# Patient Record
Sex: Male | Born: 1965 | Race: Black or African American | Hispanic: No | Smoking: Never smoker
Health system: Southern US, Community
[De-identification: ages and names within clinical notes are randomized; demographics above are authoritative.]

## PROBLEM LIST (undated history)

## (undated) HISTORY — PX: NO PAST SURGERIES: SHX2092

---

## 2006-04-01 ENCOUNTER — Emergency Department: Payer: Self-pay | Admitting: Emergency Medicine

## 2014-03-15 ENCOUNTER — Ambulatory Visit: Payer: Self-pay | Admitting: Physician Assistant

## 2014-05-18 ENCOUNTER — Ambulatory Visit
Admit: 2014-05-18 | Disposition: A | Discharge status: Home / Self Care | Payer: Self-pay | Attending: Family Medicine | Admitting: Family Medicine

## 2014-05-18 LAB — OCCULT BLOOD X 1 CARD TO LAB, STOOL: Occult Blood, Feces: NEGATIVE

## 2017-01-29 ENCOUNTER — Emergency Department
Admission: EM | Admit: 2017-01-29 | Discharge: 2017-01-29 | Disposition: A | Discharge status: Home / Self Care | Payer: BLUE CROSS/BLUE SHIELD | Attending: Student in an Organized Health Care Education/Training Program | Admitting: Student in an Organized Health Care Education/Training Program

## 2017-01-29 ENCOUNTER — Encounter: Payer: Self-pay | Admitting: Emergency Medicine

## 2017-01-29 ENCOUNTER — Other Ambulatory Visit: Payer: Self-pay

## 2017-01-29 DIAGNOSIS — R600 Localized edema: Secondary | ICD-10-CM | POA: Diagnosis present

## 2017-01-29 DIAGNOSIS — K4090 Unilateral inguinal hernia, without obstruction or gangrene, not specified as recurrent: Secondary | ICD-10-CM | POA: Insufficient documentation

## 2017-01-29 DIAGNOSIS — H1031 Unspecified acute conjunctivitis, right eye: Secondary | ICD-10-CM | POA: Diagnosis not present

## 2017-01-29 MED ORDER — TOBRAMYCIN 0.3 % OP SOLN
2.0000 [drp] | Freq: Once | OPHTHALMIC | Status: AC
  Administered 2017-01-29: 2 [drp] via OPHTHALMIC
  Filled 2017-01-29: qty 5

## 2017-01-29 MED ORDER — TOBRAMYCIN 0.3 % OP SOLN: 1.0000 [drp] | OPHTHALMIC | 0 refills | Status: AC

## 2017-01-29 NOTE — ED Notes (Signed)
Pt c/o eye irritation to RT eye, redness and irritation noted. Pt states discharge. Pt denies blurred vision .

## 2017-01-29 NOTE — ED Triage Notes (Signed)
First Nurse Note:  Arrives with C/O pink eye to right eye x 1 day.  Right eye swelling noted.

## 2017-01-29 NOTE — ED Provider Notes (Signed)
[facility] Emergency Department Provider Note  ____________________________________________  Time seen: Approximately 4:40 PM  I have reviewed the triage vital signs and the nursing notes.   HISTORY  Chief Complaint Eye Problem    HPI Calvin Carlson is a 51 y.o. male presents to the emergency department with right eye conjunctivitis that has been apparent for approximately 1 day with upper eyelid edema.  He has also had purulent discharge from the right eye.  He secondarily would like a referral to general surgery to address right inguinal hernia.   History reviewed. No pertinent past medical history.  There are no active problems to display for this patient.   History reviewed. No pertinent surgical history.  Prior to Admission medications   Not on File    Allergies Patient has no known allergies.  No family history on file.  Social History Social History   Tobacco Use  . Smoking status: Never Smoker  . Smokeless tobacco: Never Used  Substance Use Topics  . Alcohol use: Not on file  . Drug use: Not on file     Review of Systems  Constitutional: No fever/chills Eyes: Patient has right eye conjunctivitis.  ENT: No upper respiratory complaints. Cardiovascular: no chest pain. Respiratory: no cough. No SOB. Gastrointestinal: No abdominal pain.  No nausea, no vomiting.  No diarrhea.  No constipation. Genitourinary: Patient has right inguinal hernia.  Musculoskeletal: Negative for musculoskeletal pain. Skin: Negative for rash, abrasions, lacerations, ecchymosis. Neurological: Negative for headaches, focal weakness or numbness.   ____________________________________________   PHYSICAL EXAM:  VITAL SIGNS: ED Triage Vitals  Enc Vitals Group     BP 01/29/17 1506 (!) 143/96     Pulse Rate 01/29/17 1506 78     Resp 01/29/17 1506 16     Temp 01/29/17 1506 97.8 F (36.6 C)     Temp Source 01/29/17 1506 Oral     SpO2 01/29/17  1506 98 %     Weight 01/29/17 1507 219 lb (99.3 kg)     Height 01/29/17 1507 5\' 11"  (1.803 m)     Head Circumference --      Peak Flow --      Pain Score 01/29/17 1447 0     Pain Loc --      Pain Edu? --      Excl. in GC? --      Constitutional: Alert and oriented. Well appearing and in no acute distress. Eyes: Patient has right eye conjunctivitis with purulent discharge and right upper eyelid edema.  PERRL. EOMI. Head: Atraumatic. ENT:      Ears: TMs are pearly bilaterally.      Nose: No congestion/rhinnorhea.      Mouth/Throat: Mucous membranes are moist.  Hematological/Lymphatic/Immunilogical: No cervical lymphadenopathy. Cardiovascular: Normal rate, regular rhythm. Normal S1 and S2.  Good peripheral circulation. Respiratory: Normal respiratory effort without tachypnea or retractions. Lungs CTAB. Good air entry to the bases with no decreased or absent breath sounds. Gastrointestinal: Bowel sounds 4 quadrants. Soft and nontender to palpation. No guarding or rigidity. No palpable masses. No distention. No CVA tenderness.  Patient has palpable, reducible right inguinal hernia. Musculoskeletal: Full range of motion to all extremities. No gross deformities appreciated. Neurologic:  Normal speech and language. No gross focal neurologic deficits are appreciated.  Skin:  Skin is warm, dry and intact. No rash noted.  ____________________________________________   LABS (all labs ordered are listed, but only abnormal results are displayed)  Labs Reviewed - No data to  display ____________________________________________  EKG   ____________________________________________  RADIOLOGY   No results found.  ____________________________________________    PROCEDURES  Procedure(s) performed:    Procedures    Medications  tobramycin (TOBREX) 0.3 % ophthalmic solution 2 drop (not administered)     ____________________________________________   INITIAL IMPRESSION /  ASSESSMENT AND PLAN / ED COURSE  Pertinent labs & imaging results that were available during my care of the patient were reviewed by me and considered in my medical decision making (see chart for details).  Review of the [place] CSRS was performed in accordance of the [facility] prior to dispensing any controlled drugs.     Assessment and plan Right eye conjunctivitis Right inguinal hernia Patient presents to the emergency department with right eye conjunctivitis with matting and crusting.  Patient was discharged with tobramycin.  Patient was also given a referral to general surgery.  All patient questions were answered. ____________________________________________  FINAL CLINICAL IMPRESSION(S) / ED DIAGNOSES  Final diagnoses:  Acute bacterial conjunctivitis of right eye      NEW MEDICATIONS STARTED DURING THIS VISIT:  ED Discharge Orders    None          This chart was dictated using voice recognition software/Dragon. Despite best efforts to proofread, errors can occur which can change the meaning. Any change was purely unintentional.    Orvil FeilWoods, Rodriguez Aguinaldo M, PA-C 01/29/17 1647    Willy Eddyobinson, Patrick, MD 02/02/17 [phone]

## 2017-02-12 ENCOUNTER — Ambulatory Visit (INDEPENDENT_AMBULATORY_CARE_PROVIDER_SITE_OTHER): Payer: BLUE CROSS/BLUE SHIELD | Admitting: Surgery

## 2017-02-12 ENCOUNTER — Encounter: Payer: Self-pay | Admitting: Surgery

## 2017-02-12 VITALS — BP 152/99 | HR 80 | Temp 97.6°F | Ht 71.0 in | Wt 224.5 lb

## 2017-02-12 DIAGNOSIS — K4090 Unilateral inguinal hernia, without obstruction or gangrene, not specified as recurrent: Secondary | ICD-10-CM | POA: Diagnosis not present

## 2017-02-12 NOTE — Patient Instructions (Addendum)
We have seen you today for an Inguinal Hernia.  You may get a "Hernia Truss" at a Medical Supply Store. A hernia truss will provide some counter pressure against your hernia and help control your pain and give you more comfort in that area.  A few in our area are:  [facility] [address] [place], [place] [phone]  [facility] [address], [place], [place] [phone]  [facility] [address], [place], [place] [phone]  If the hernia is causing more pain and has a firm bulge in this area, lie down and try to push the protruding part in slowly and gently. It is much easier to get this to go back in, when you are lying flat.  Highlighted below are the urgent symptoms with a Strangulated hernia. This is a medical emergency and if you experience any of these symptoms, go immediately to the emergency department.    Inguinal Hernia, Adult An inguinal hernia is when fat or the intestines push through the area where the leg meets the lower abdomen (groin) and create a rounded lump (bulge). This condition develops over time. There are three types of inguinal hernias. These types include:  Hernias that can be pushed back into the belly (are reducible).  Hernias that are not reducible (are incarcerated).  Hernias that are not reducible and lose their blood supply (are strangulated). This type of hernia requires emergency surgery.  What are the causes? This condition is caused by having a weak spot in the muscles or tissue. This weakness lets the hernia poke through. This condition can be triggered by:  Suddenly straining the muscles of the lower abdomen.  Lifting heavy objects.  Straining to have a bowel movement. Difficult bowel movements (constipation) can lead to this.  Coughing.  What increases the risk? This condition is more likely to develop in:  Men.  Pregnant women.  People who: ? Are  overweight. ? Work in jobs that require long periods of standing or heavy lifting. ? Have had an inguinal hernia before. ? Smoke or have lung disease. These factors can lead to long-lasting (chronic) coughing.  What are the signs or symptoms? Symptoms can depend on the size of the hernia. Often, a small inguinal hernia has no symptoms. Symptoms of a larger hernia include:  A lump in the groin. This is easier to see when the person is standing. It might not be visible when he or she is lying down.  Pain or burning in the groin. This occurs especially when lifting, straining, or coughing.  A dull ache or a feeling of pressure in the groin.  A lump in the scrotum in men.  Symptoms of a strangulated inguinal hernia can include:  A bulge in the groin that is very painful and tender to the touch.  A bulge that turns red or purple.  Fever, nausea, and vomiting.  The inability to have a bowel movement or to pass gas.  How is this diagnosed? This condition is diagnosed with a medical history and physical exam. Your health care provider may feel your groin area and ask you to cough. How is this treated? Treatment for this condition varies depending on the size of your hernia and whether you have symptoms. If you do not have symptoms, your health care provider may have you watch your hernia carefully and come in for follow-up visits. If your hernia is larger or if you have symptoms, your treatment will include  surgery. Follow these instructions at home: Lifestyle  Drink enough fluid to keep your urine clear or pale yellow.  Eat a diet that includes a lot of fiber. Eat plenty of fruits, vegetables, and whole grains. Talk with your health care provider if you have questions.  Avoid lifting heavy objects.  Avoid standing for long periods of time.  Do not use tobacco products, including cigarettes, chewing tobacco, or e-cigarettes. If you need help quitting, ask your health care  provider.  Maintain a healthy weight. General instructions  Do not try to force the hernia back in.  Watch your hernia for any changes in color or size. Let your health care provider know if any changes occur.  Take over-the-counter and prescription medicines only as told by your health care provider.  Keep all follow-up visits as told by your health care provider. This is important. Contact a health care provider if:  You have a fever.  You have new symptoms.  Your symptoms get worse. Get help right away if:  You have pain in the groin that suddenly gets worse.  A bulge in the groin gets bigger suddenly and does not go down.  You are a man and you have a sudden pain in the scrotum, or the size of your scrotum suddenly changes.  A bulge in the groin area becomes red or purple and is painful to the touch.  You have nausea or vomiting that does not go away.  You feel your heart beating a lot more quickly than normal.  You cannot have a bowel movement or pass gas. This information is not intended to replace advice given to you by your health care provider. Make sure you discuss any questions you have with your health care provider. Document Released: 06/10/2008 Document Revised: 06/30/2015 Document Reviewed: 12/02/2013 Elsevier Interactive Patient Education  2018 [employer]

## 2017-02-12 NOTE — Progress Notes (Signed)
02/12/2017  Reason for Visit:  Right inguinal hernia  History of Present Illness: Calvin Carlson is a 52 y.o. male who presents for evaluation of a right inguinal hernia.  Patient reports that he first noticed this may be a little bit more than 3 months ago.  Reports that he feels a tingliness sensation that then becomes like sharp pain which then becomes more of a burning sensation as the hernia defect bulges out.  There is no radiation towards leg or other areas.  However it reduces on its own as he sits down or lies in bed.  He is also able to reduce it himself.  He does feel that this is worse towards the end of a long day at work.  He works for [employer] and [employer]  a lot.  He has been working about 12-hour days over the last couple of months.  Denies any fevers, chills, chest pain, shortness of breath, nausea, vomiting, abdominal pain, constipation, diarrhea, blood in the stool, or any difficulty with urination.  Past Medical History: --Right inguinal hernia  Past Surgical History: --None  Home Medications: Prior to Admission medications   None    Allergies: No Known Allergies  Social History:  reports that  has never smoked. he has never used smokeless tobacco. He reports that he does not drink alcohol or use drugs.   Family History: Family History  Problem Relation Age of Onset  . Hypertension Mother   . Hyperlipidemia Mother   . Heart disease Father     Review of Systems: Review of Systems  Constitutional: Negative for chills and fever.  HENT: Negative for hearing loss.   Eyes: Negative for blurred vision.  Respiratory: Negative for shortness of breath.   Cardiovascular: Negative for chest pain.  Gastrointestinal: Negative for abdominal pain, blood in stool, constipation, diarrhea, nausea and vomiting.  Genitourinary: Negative for dysuria and frequency.  Musculoskeletal: Negative for myalgias.  Skin: Negative for rash.  Neurological: Negative for dizziness.   Psychiatric/Behavioral: Negative for depression.  All other systems reviewed and are negative.   Physical Exam BP (!) 152/99   Pulse 80   Temp 97.6 F (36.4 C) (Oral)   Ht 5\' 11"  (1.803 m)   Wt 101.8 kg (224 lb 8 oz)   BMI 31.31 kg/m  CONSTITUTIONAL: No acute distress HEENT:  Normocephalic, atraumatic, extraocular motion intact. NECK: Trachea is midline, and there is no jugular venous distension.  RESPIRATORY:  Lungs are clear, and breath sounds are equal bilaterally. Normal respiratory effort without pathologic use of accessory muscles. CARDIOVASCULAR: Heart is regular without murmurs, gallops, or rubs. GI: The abdomen is soft, nondistended, nontender to palpation.  On exam, there is a small reducible right inguinal hernia which is nontender to palpation.  There are no hernias on the left side. MUSCULOSKELETAL:  Normal muscle strength and tone in all four extremities.  No peripheral edema or cyanosis. SKIN: Skin turgor is normal. There are no pathologic skin lesions.  NEUROLOGIC:  Motor and sensation is grossly normal.  Cranial nerves are grossly intact. PSYCH:  Alert and oriented to person, place and time. Affect is normal.  Laboratory Analysis: None  Imaging: None  Assessment and Plan: This is a 52 y.o. male who presents with a reducible right inguinal hernia.  Discussed with the patient the spectrum of inguinal hernias ranging from reducible to strangulated and that given his presentation, there is currently no urgency or emergency to proceeding with surgical repair.  Discussed with the patient the  potential use of conservative measures to try to reduce some of his symptoms, but unfortunately there is no nonsurgical way to eliminate the hernia.  Discussed with the patient the role of hernia underwear or trusses.  The patient is interested in pursuing conservative measures for now and wants to give the hernia underwear a try.  Did encourage the patient to try this and if he  feels that this does not control his symptoms well enough or if he feels that the hernia symptoms are worsening, then he is free to give us a call to set up another appointment and schedule him for surgery.  Otherwise we will continue with watchful waiting for now.  The patient understands this plan and all of his questions have been answered.  Face-to-face time spent with the patient and care providers was 45 minutes, with more than 50% of the time spent counseling, educating, and coordinating care of the patient.     [name], MD [facility]

## 2018-10-01 ENCOUNTER — Other Ambulatory Visit: Payer: Self-pay

## 2018-10-01 ENCOUNTER — Ambulatory Visit
Admission: EM | Admit: 2018-10-01 | Discharge: 2018-10-01 | Disposition: A | Discharge status: Home / Self Care | Payer: BC Managed Care – PPO | Attending: Family Medicine | Admitting: Family Medicine

## 2018-10-01 ENCOUNTER — Ambulatory Visit (INDEPENDENT_AMBULATORY_CARE_PROVIDER_SITE_OTHER): Payer: BC Managed Care – PPO

## 2018-10-01 ENCOUNTER — Encounter: Payer: Self-pay | Admitting: Emergency Medicine

## 2018-10-01 DIAGNOSIS — R079 Chest pain, unspecified: Secondary | ICD-10-CM | POA: Diagnosis not present

## 2018-10-01 DIAGNOSIS — J209 Acute bronchitis, unspecified: Secondary | ICD-10-CM

## 2018-10-01 MED ORDER — BENZONATATE 200 MG PO CAPS: 200.0000 mg | ORAL_CAPSULE | Freq: Three times a day (TID) | ORAL | 0 refills | Status: DC | PRN

## 2018-10-01 MED ORDER — AZITHROMYCIN 250 MG PO TABS: ORAL_TABLET | ORAL | 0 refills | Status: DC

## 2018-10-01 NOTE — ED Provider Notes (Signed)
[facility]    CSN: [id number] Arrival date & time: 10/01/18  1113  History   Chief Complaint Chief Complaint  Patient presents with  . Cough   HPI  53 year old male presents with cough.  Patient reports 4-day history of cough, congestion, wheezing.  No fever.  He states that his wife has recently been sick but seems to be doing better after she visited her doctor.  No known contacts with COVID-19.  Patient believes that his wife was tested and was negative.  No medications or interventions tried.  No known relieving factors.  Worse at night.  Cough interferes with sleep.  No other associated symptoms.  No other complaints or concerns at this time.  PMH, Surgical Hx, Family Hx, Social History reviewed and updated as below.  PMH: Hx of Hernia Patient Active Problem List   Diagnosis Date Noted  . Reducible right inguinal hernia 02/12/2017   Past Surgical History:  Procedure Laterality Date  . NO PAST SURGERIES     Home Medications    Prior to Admission medications   Medication Sig Start Date End Date Taking? Authorizing Provider  azithromycin (ZITHROMAX) 250 MG tablet 2 tablets on day 1, then 1 tablet daily on days 2-5. 10/01/18   Tommie Samsook, Danalee Flath G, DO  benzonatate (TESSALON) 200 MG capsule Take 1 capsule (200 mg total) by mouth 3 (three) times daily as needed for cough. 10/01/18   Tommie Samsook, Raymie Trani G, DO    Family History Family History  Problem Relation Age of Onset  . Hypertension Mother   . Hyperlipidemia Mother   . Heart disease Father     Social History Social History   Tobacco Use  . Smoking status: Never Smoker  . Smokeless tobacco: Never Used  Substance Use Topics  . Alcohol use: No    Frequency: Never  . Drug use: No     Allergies   Patient has no known allergies.   Review of Systems Review of Systems  Constitutional: Negative for fever.  Respiratory: Positive for cough and wheezing.    Physical Exam Triage Vital Signs ED Triage Vitals   Enc Vitals Group     BP 10/01/18 1153 (!) 143/97     Pulse Rate 10/01/18 1153 82     Resp 10/01/18 1153 18     Temp 10/01/18 1153 98.7 F (37.1 C)     Temp Source 10/01/18 1153 Oral     SpO2 10/01/18 1153 98 %     Weight 10/01/18 1150 225 lb (102.1 kg)     Height 10/01/18 1150 5\' 11"  (1.803 m)     Head Circumference --      Peak Flow --      Pain Score 10/01/18 1150 0     Pain Loc --      Pain Edu? --      Excl. in GC? --    Updated Vital Signs BP (!) 143/97 (BP Location: Left Arm)   Pulse 82   Temp 98.7 F (37.1 C) (Oral)   Resp 18   Ht 5\' 11"  (1.803 m)   Wt 102.1 kg   SpO2 98%   BMI 31.38 kg/m   Visual Acuity Right Eye Distance:   Left Eye Distance:   Bilateral Distance:    Right Eye Near:   Left Eye Near:    Bilateral Near:     Physical Exam Vitals signs and nursing note reviewed.  Constitutional:      General: He is not in  acute distress.    Appearance: Normal appearance. He is not ill-appearing.  HENT:     Head: Normocephalic and atraumatic.     Nose: Nose normal.     Mouth/Throat:     Pharynx: Oropharynx is clear. No posterior oropharyngeal erythema.  Eyes:     General:        Right eye: No discharge.        Left eye: No discharge.     Conjunctiva/sclera: Conjunctivae normal.  Cardiovascular:     Rate and Rhythm: Normal rate and regular rhythm.     Heart sounds: No murmur.  Pulmonary:     Effort: Pulmonary effort is normal.     Comments: Coarse breath sounds. Skin:    General: Skin is warm.     Findings: No rash.  Neurological:     Mental Status: He is alert.  Psychiatric:        Mood and Affect: Mood normal.        Behavior: Behavior normal.    UC Treatments / Results  Labs (all labs ordered are listed, but only abnormal results are displayed) Labs Reviewed  NOVEL CORONAVIRUS, NAA (HOSP ORDER, SEND-OUT TO REF LAB; TAT 18-24 HRS)    EKG   Radiology Dg Chest 2 View  Result Date: 10/01/2018 CLINICAL DATA:  Cough and chest  congestion. EXAM: PORTABLE CHEST 1 VIEW COMPARISON:  Scout image for cardiac CT scan dated 12/23/2017 FINDINGS: The heart size and pulmonary vascularity are normal. There is peribronchial thickening with slight accentuation of the markings in the right midzone. No consolidative infiltrates or effusions. No significant bone abnormality. IMPRESSION: Bronchitic changes. Electronically Signed   By: Lorriane Shire M.D.   On: 10/01/2018 12:34    Procedures Procedures (including critical care time)  Medications Ordered in UC Medications - No data to display  Initial Impression / Assessment and Plan / UC Course  I have reviewed the triage vital signs and the nursing notes.  Pertinent labs & imaging results that were available during my care of the patient were reviewed by me and considered in my medical decision making (see chart for details).    53 year old male presents with acute bronchitis.  X-ray findings consistent with bronchitis as well.  Treating with azithromycin and Tessalon Perles.  Final Clinical Impressions(s) / UC Diagnoses   Final diagnoses:  Acute bronchitis, unspecified organism   Discharge Instructions   None    ED Prescriptions    Medication Sig Dispense Auth. Provider   azithromycin (ZITHROMAX) 250 MG tablet 2 tablets on day 1, then 1 tablet daily on days 2-5. 6 tablet Elda Dunkerson G, DO   benzonatate (TESSALON) 200 MG capsule Take 1 capsule (200 mg total) by mouth 3 (three) times daily as needed for cough. 30 capsule Coral Spikes, DO     Controlled Substance Prescriptions [place] Controlled Substance Registry consulted? Not Applicable   Coral Spikes, [place] 10/03/18 [phone]

## 2018-10-01 NOTE — ED Triage Notes (Signed)
Pt c/o cough, congestion, wheezing. Started about 4 days ago. Denies fever.

## 2018-10-02 LAB — NOVEL CORONAVIRUS, NAA (HOSP ORDER, SEND-OUT TO REF LAB; TAT 18-24 HRS): SARS-CoV-2, NAA: NOT DETECTED

## 2018-11-05 ENCOUNTER — Other Ambulatory Visit: Payer: Self-pay

## 2018-11-05 DIAGNOSIS — Z20822 Contact with and (suspected) exposure to covid-19: Secondary | ICD-10-CM

## 2018-11-06 LAB — NOVEL CORONAVIRUS, NAA: SARS-CoV-2, NAA: NOT DETECTED

## 2020-10-23 IMAGING — CR CHEST - 2 VIEW
2 series · 2 of 2 positions shown · non-contrast
Comparison: Scout image for cardiac CT scan dated 12/23/2017

CLINICAL DATA: Cough and chest congestion.

EXAM:
PORTABLE CHEST 1 VIEW

[chest pa]
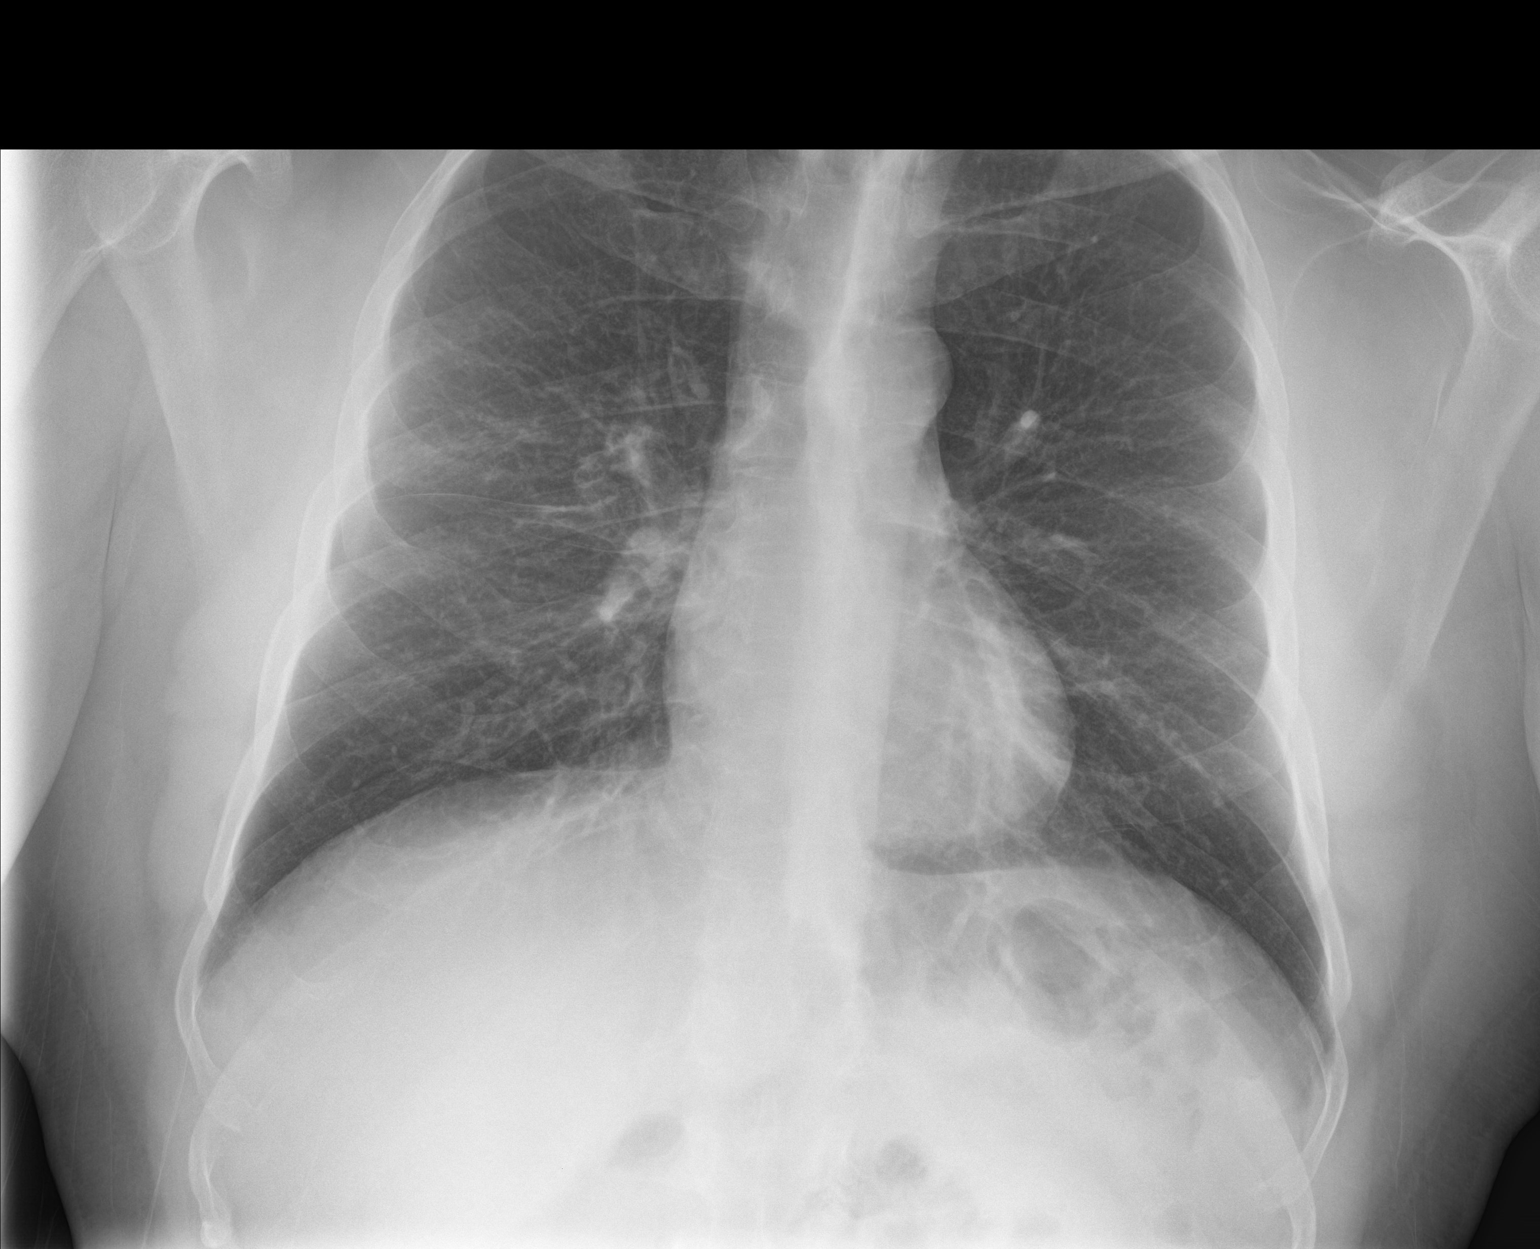

[chest lat]
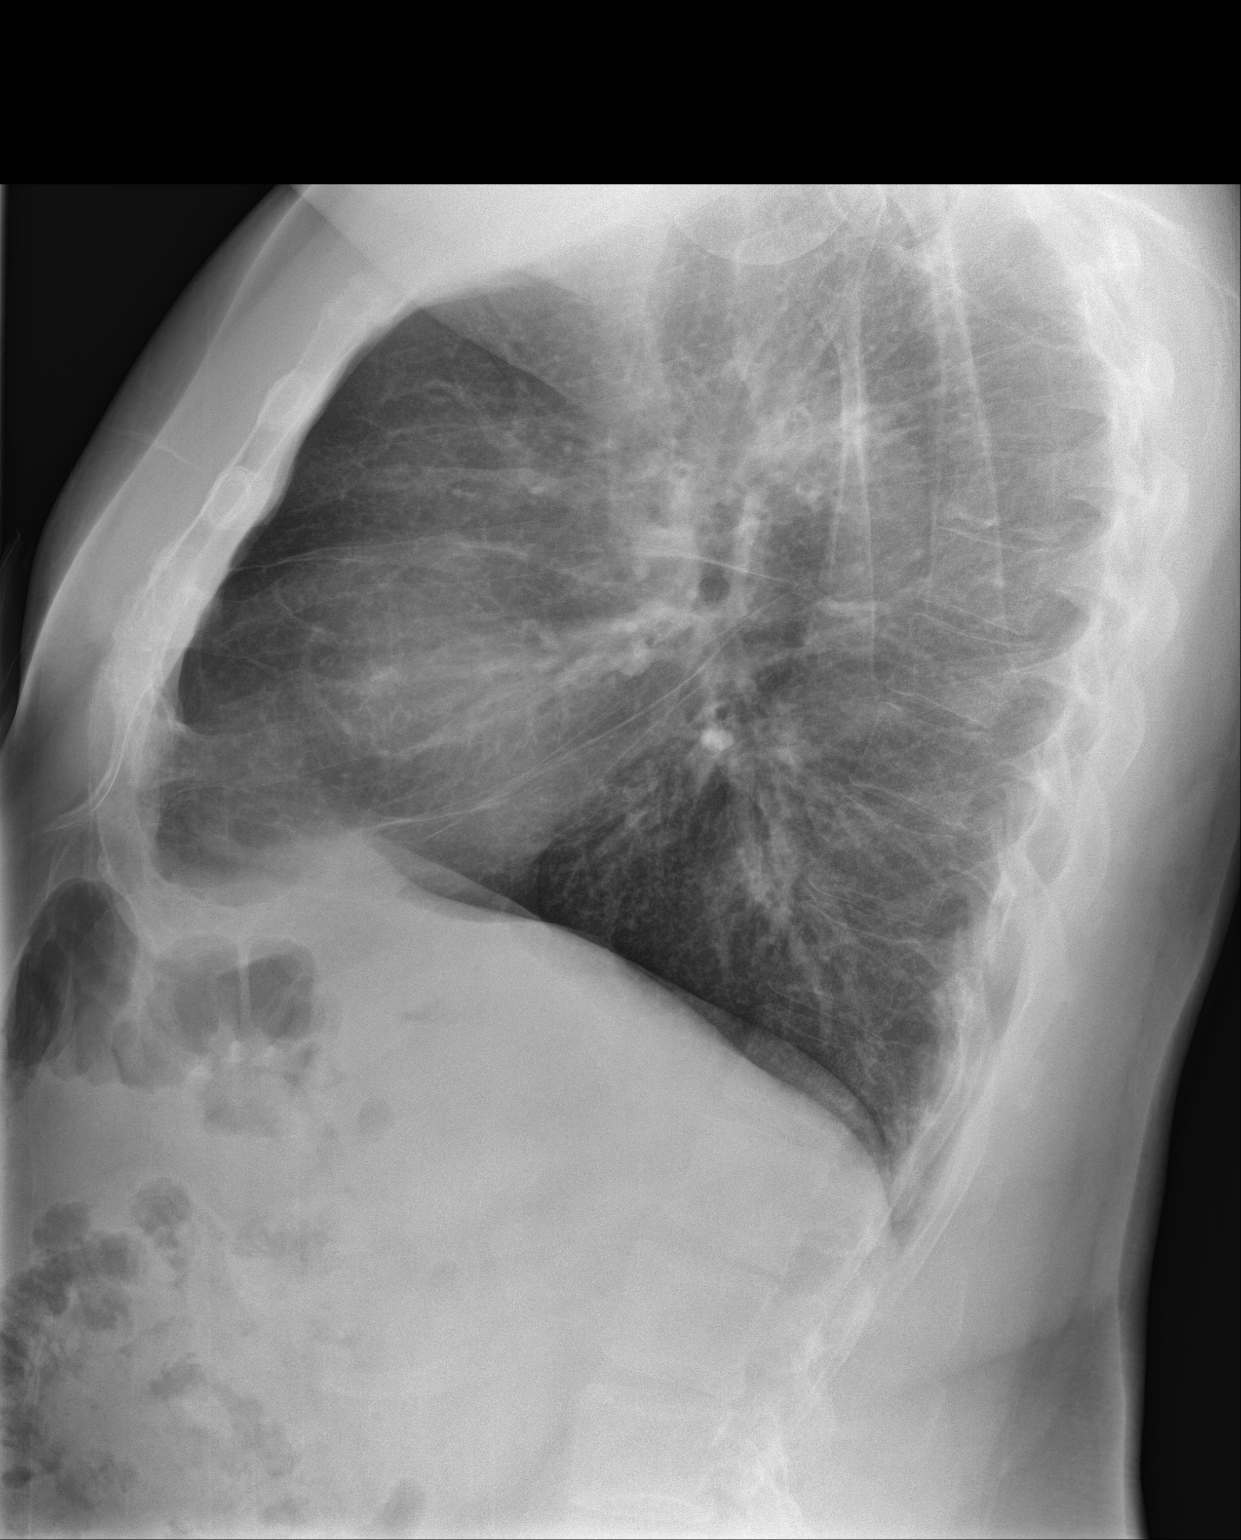

[2 of 2 positions shown; findings below may reference images not displayed]

FINDINGS: The heart size and pulmonary vascularity are normal. There is
peribronchial thickening with slight accentuation of the markings in
the right midzone. No consolidative infiltrates or effusions. No
significant bone abnormality.
IMPRESSION: Bronchitic changes.

## 2021-05-02 ENCOUNTER — Other Ambulatory Visit: Payer: Self-pay

## 2021-05-02 ENCOUNTER — Encounter: Payer: Self-pay | Admitting: Urology

## 2021-05-02 ENCOUNTER — Ambulatory Visit (INDEPENDENT_AMBULATORY_CARE_PROVIDER_SITE_OTHER): Payer: Self-pay | Admitting: Urology

## 2021-05-02 VITALS — BP 153/96 | HR 93 | Ht 71.0 in | Wt 225.0 lb

## 2021-05-02 DIAGNOSIS — Z125 Encounter for screening for malignant neoplasm of prostate: Secondary | ICD-10-CM

## 2021-05-02 NOTE — Progress Notes (Signed)
? ?  05/02/21 ?10:36 AM  ? ?Collene Gobble ?May 25, 1965 ?[id number] ? ?CC: PSA screening ? ?HPI: ?I saw Mr. Lockner today for evaluation of PSA screening.  He is a 56 year old relatively healthy male with no family history of prostate cancer, no urinary symptoms, no gross hematuria, and no ED he was found to have a recent normal PSA value of 3.26.  There are no prior values to review. ? ? ?Family History: ?Family History  ?Problem Relation Age of Onset  ? Hypertension Mother   ? Hyperlipidemia Mother   ? Heart disease Father   ? ? ?Social History:  reports that he has never smoked. He has never used smokeless tobacco. He reports that he does not drink alcohol and does not use drugs. ? ?Physical Exam: ?BP (!) 153/96 (BP Location: Left Arm, Patient Position: Sitting, Cuff Size: Large)   Pulse 93   Ht 5\' 11"  (1.803 m)   Wt 225 lb (102.1 kg)   BMI 31.38 kg/m?   ? ?Constitutional:  Alert and oriented, No acute distress. ?Cardiovascular: No clubbing, cyanosis, or edema. ?Respiratory: Normal respiratory effort, no increased work of breathing. ?GI: Abdomen is soft, nontender, nondistended, no abdominal masses ?DRE: Deferred using shared decision making ? ?Laboratory Data: ?Reviewed, see HPI ? ?Assessment & Plan:   ?56 year old male with no family history of prostate cancer who was referred for a normal PSA value of 3.26. ? ?We reviewed the risks and benefits of PSA screening and the uncertainty surrounding it, as well as the AUA guidelines with recommendations regarding screening. In general, a man's PSA increases with age and is produced by both normal and cancerous prostate tissue. The differential diagnosis for elevated PSA includes BPH, prostate cancer, infection, recent intercourse/ejaculation, recent urethroscopic manipulation (foley placement/cystoscopy) or trauma, and prostatitis.  ? ?Management of an elevated PSA can include observation or prostate biopsy and we discussed this in detail. Our goal is to detect  clinically significant prostate cancers, and manage with either active surveillance, surgery, or radiation for localized disease. Risks of prostate biopsy include bleeding, infection (including life threatening sepsis), pain, and lower urinary symptoms. Hematuria, hematospermia, and blood in the stool are all common after biopsy and can persist up to 4 weeks.  ? ?Using shared decision making, he opted for a repeat PSA with reflex to free in 1 year ? ?Nickolas Madrid, MD ?05/02/2021 ? ?[facility] ?[address] ?[place], [place] [address] ?[phone] ? ? ?

## 2021-05-02 NOTE — Patient Instructions (Addendum)
Prostate Cancer Screening ?Prostate cancer screening is testing that is done to check for the presence of prostate cancer in men. The prostate gland is a walnut-sized gland that is located below the bladder and in front of the rectum in males. The function of the prostate is to add fluid to semen during ejaculation. Prostate cancer is one of the most common types of cancer in men ?A normal PSA value is less than 4 for men under 70. ?Who should have prostate cancer screening? ?Screening recommendations vary based on age and other risk factors, as well as between the professional organizations who make the recommendations. ?In general, screening is recommended if: ?You are age 50 to 53 and have an average risk for prostate cancer. You should talk with your health care provider about your need for screening and how often screening should be done. Because most prostate cancers are slow growing and will not cause death, screening in this age group is generally reserved for men who have a 10- to 15-year life expectancy. ?You are younger than age 43, and you have these risk factors: ?Having a father, brother, or uncle who has been diagnosed with prostate cancer. The risk is higher if your family member's cancer occurred at an early age or if you have multiple family members with prostate cancer at an early age. ?Being a male who is [place] or is of [place] or sub-Saharan African descent. ?In general, screening is not recommended if: ?You are younger than age 28. ?You are between the ages of 38 and 74 and you have no risk factors. ?You are 10 years of age or older. At this age, the risks that screening can cause are greater than the benefits that it may provide. ?If you are at high risk for prostate cancer, your health care provider may recommend that you have screenings more often or that you start screening at a younger age. ?How is screening for prostate cancer done? ?The recommended prostate cancer screening test is a  blood test called the prostate-specific antigen (PSA) test. PSA is a protein that is made in the prostate. As you age, your prostate naturally produces more PSA. Abnormally high PSA levels may be caused by: ?Prostate cancer. ?An enlarged prostate that is not caused by cancer (benign prostatic hyperplasia, or BPH). This condition is very common in older men. ?A prostate gland infection (prostatitis) or urinary tract infection. ?Certain medicines such as male hormones (like testosterone) or other medicines that raise testosterone levels. ?A rectal exam may be done as part of prostate cancer screening to help provide information about the size of your prostate gland. When a rectal exam is performed, it should be done after the PSA level is drawn to avoid any effect on the results. ?Depending on the PSA results, you may need more tests, such as: ?A physical exam to check the size of your prostate gland, if not done as part of screening. ?Blood and imaging tests. ?A procedure to remove tissue samples from your prostate gland for testing (biopsy). This is the only way to know for certain if you have prostate cancer. ?What are the benefits of prostate cancer screening? ?Screening can help to identify cancer at an early stage, before symptoms start and when the cancer can be treated more easily. ?There is a small chance that screening may lower your risk of dying from prostate cancer. The chance is small because prostate cancer is a slow-growing cancer, and most men with prostate cancer die from a  different cause. ?What are the risks of prostate cancer screening? ?The main risk of prostate cancer screening is diagnosing and treating prostate cancer that would never have caused any symptoms or problems. This is called overdiagnosisand overtreatment. PSA screening cannot tell you if your PSA is high due to cancer or a different cause. A prostate biopsy is the only procedure to diagnose prostate cancer. Even the results of a  biopsy may not tell you if your cancer needs to be treated. Slow-growing prostate cancer may not need any treatment other than monitoring, so diagnosing and treating it may cause unnecessary stress or other side effects. ?Questions to ask your health care provider ?When should I start prostate cancer screening? ?What is my risk for prostate cancer? ?How often do I need screening? ?What type of screening tests do I need? ?How do I get my test results? ?What do my results mean? ?Do I need treatment? ?Where to find more information ?The American Cancer Society: www.cancer.org ?American Urological Association: www.auanet.org ?Contact a health care provider if: ?You have difficulty urinating. ?You have pain when you urinate or ejaculate. ?You have blood in your urine or semen. ?You have pain in your back or in the area of your prostate. ?Summary ?Prostate cancer is a common type of cancer in men. The prostate gland is located below the bladder and in front of the rectum. This gland adds fluid to semen during ejaculation. ?Prostate cancer screening may identify cancer at an early stage, when the cancer can be treated more easily and is less likely to have spread to other areas of the body. ?The prostate-specific antigen (PSA) test is the recommended screening test for prostate cancer, but it has associated risks. ?Discuss the risks and benefits of prostate cancer screening with your health care provider. If you are age 17 or older, the risks that screening can cause are greater than the benefits that it may provide. ?This information is not intended to replace advice given to you by your health care provider. Make sure you discuss any questions you have with your health care provider. ?Document Revised: 07/18/2020 Document Reviewed: 07/18/2020 ?Elsevier Patient Education ? 2022 Elsevier Inc. ? ?

## 2021-06-18 ENCOUNTER — Ambulatory Visit
Admission: EM | Admit: 2021-06-18 | Discharge: 2021-06-18 | Disposition: A | Discharge status: Home / Self Care | Payer: BC Managed Care – PPO | Attending: Emergency Medicine | Admitting: Emergency Medicine

## 2021-06-18 DIAGNOSIS — K649 Unspecified hemorrhoids: Secondary | ICD-10-CM

## 2021-06-18 NOTE — ED Provider Notes (Signed)
?[facility] [place] ? ? ?Name: Calvin Carlson ?DOB: 30-Aug-1965 ?MRN: [id number] ?CSN: [id number] ?PCP: Dan Humphreys, MD ? ?Arrival date and time:  ?06/18/21 1304 ? ?Chief Complaint:  ?Rectal Pain ? ? ?NOTE: Prior to seeing the patient today, I have reviewed the triage nursing documentation and vital signs. Clinical staff has updated patient's PMH/PSHx, current medication list, and drug allergies/intolerances to ensure comprehensive history available to assist in medical decision making.  ? ?History:   ?HPI: Calvin Carlson is a 56 y.o. male who presents today with complaints of rectal pain x2-3 days.  Patient states this pain is mostly with bowel movements.  He states he previously has not strained a bowel movement but he has been straining recently.  He denies any symptoms without bowel movements.  He denies nausea, abdominal pain, diarrhea.  He states he was diagnosed with a "intestinal infection" at this facility when he had similar symptoms a year ago and no further work-up was done after he completed his antibiotics. ?Chart reviewed.  The only additional visit [facility] visit was in 2020 when he was diagnosed with acute bronchitis. ? ? ?History reviewed. No pertinent past medical history. ? ?Past Surgical History:  ?Procedure Laterality Date  ? NO PAST SURGERIES    ? ? ?Family History  ?Problem Relation Age of Onset  ? Hypertension Mother   ? Hyperlipidemia Mother   ? Heart disease Father   ? ? ?Social History  ? ?Tobacco Use  ? Smoking status: Never  ? Smokeless tobacco: Never  ?Vaping Use  ? Vaping Use: Never used  ?Substance Use Topics  ? Alcohol use: No  ? Drug use: No  ? ? ?Patient Active Problem List  ? Diagnosis Date Noted  ? Reducible right inguinal hernia 02/12/2017  ? ? ?Home Medications:   ? ?No outpatient medications have been marked as taking for the 06/18/21 encounter [facility] Encounter).  ? ? ?Allergies:   ?Patient has no known allergies. ? ?Review of Systems (ROS): ?Review  of Systems  ?Constitutional:  Negative for activity change, appetite change, chills, diaphoresis, fatigue and fever.  ?Gastrointestinal:  Positive for rectal pain. Negative for abdominal distention, abdominal pain, anal bleeding, blood in stool, constipation, diarrhea, nausea and vomiting.  ?All other systems reviewed and are negative.  ? ?Vital Signs: ?Today's Vitals  ? 06/18/21 1312 06/18/21 1314 06/18/21 1355  ?BP:  (!) 148/86   ?Pulse:  (!) 59   ?Resp:  18   ?Temp:  97.7 ?F (36.5 ?C)   ?TempSrc:  Oral   ?SpO2:  100%   ?Weight: 222 lb (100.7 kg)    ?Height: 5\' 11"  (1.803 m)    ?PainSc: 5   5   ? ? ?Physical Exam: ?Physical Exam ?Vitals and nursing note reviewed. Chaperone present: Chaperone deferred.  ?Constitutional:   ?   Appearance: Normal appearance.  ?Cardiovascular:  ?   Rate and Rhythm: Normal rate and regular rhythm.  ?   Pulses: Normal pulses.  ?   Heart sounds: Normal heart sounds.  ?Pulmonary:  ?   Effort: Pulmonary effort is normal.  ?   Breath sounds: Normal breath sounds.  ?Abdominal:  ?   General: Abdomen is protuberant. Bowel sounds are normal.  ?   Tenderness: There is no abdominal tenderness.  ?Genitourinary: ?   Rectum: Normal. No external hemorrhoid or internal hemorrhoid. Normal anal tone.  ?Skin: ?   General: Skin is warm and dry.  ?Neurological:  ?  General: No focal deficit present.  ?   Mental Status: He is alert and oriented to person, place, and time.  ?Psychiatric:     ?   Mood and Affect: Mood normal.     ?   Behavior: Behavior normal.  ? ? ? ?Urgent Care Treatments / Results:  ? ?LABS: ?PLEASE NOTE: all labs that were ordered this encounter are listed, however only abnormal results are displayed. ?Labs Reviewed - No data to display ? ?EKG: ?-None ? ?RADIOLOGY: ?No results found. ? ?PROCEDURES: ?Procedures ? ?MEDICATIONS RECEIVED THIS VISIT: ?Medications - No data to display ? ?PERTINENT CLINICAL COURSE NOTES/UPDATES: ?  ?Initial Impression / Assessment and Plan / Urgent Care Course:   ?Pertinent labs & imaging results that were available during my care of the patient were personally reviewed by me and considered in my medical decision making (see lab/imaging section of note for values and interpretations). ? ?Calvin Carlson is a 56 y.o. male who presents to [facility] today with complaints of rectal pain, diagnosed with hemorrhoids, and treated as such with the medications below. NP and patient reviewed discharge instructions below during visit.  ? ?Patient is well appearing overall in clinic today. He does not appear to be in any acute distress. Presenting symptoms (see HPI) and exam as documented above.  ? ?I have reviewed the follow up and strict return precautions for any new or worsening symptoms. Patient is aware of symptoms that would be deemed urgent/emergent, and would thus require further evaluation either here or in the emergency department. At the time of discharge, he verbalized understanding and consent with the discharge plan as it was reviewed with him. All questions were fielded by provider and/or clinic staff prior to patient discharge.   ? ?Final Clinical Impressions / Urgent Care Diagnoses:  ? ?Final diagnoses:  ?Hemorrhoids, unspecified hemorrhoid type  ? ? ?New Prescriptions:  ?[place] Controlled Substance Registry consulted? Not Applicable ? ?No orders of the defined types were placed in this encounter. ? ? ? ? ?Discharge Instructions   ? ?  ?You were seen for rectal pain and are being treated for hemorrhoids.  ? ?-Purchase these over-the-counter medications: Colace for stool softening and Preparation H for hemorrhoid relief.  Store brand versions of this medication is also fine. ?-Follow-up with your primary care provider for further evaluation. ?-Follow-up at an urgent care provider if you are symptoms worsen such as stool changes, blood or mucus in stool, abdominal pain. ? ?Take care, ?Dr. Sharlet Salina, NP-c ? ? ? ? ?Recommended Follow up Care:  ?Patient encouraged  to follow up with the following provider within the specified time frame, or sooner as dictated by the severity of his symptoms. As always, he was instructed that for any urgent/emergent care needs, he should seek care either here or in the emergency department for more immediate evaluation. ? ? ?Bailey Mech, DNP, NP-c ?  ?Bailey Mech, NP ?06/18/21 1508 ? ?

## 2021-06-18 NOTE — Discharge Instructions (Signed)
You were seen for rectal pain and are being treated for hemorrhoids.  ? ?-Purchase these over-the-counter medications: Colace for stool softening and Preparation H for hemorrhoid relief.  Store brand versions of this medication is also fine. ?-Follow-up with your primary care provider for further evaluation. ?-Follow-up at an urgent care provider if you are symptoms worsen such as stool changes, blood or mucus in stool, abdominal pain. ? ?Take care, ?Dr. Sharlet Salina, NP-c ? ?

## 2021-06-18 NOTE — ED Triage Notes (Signed)
Pt c/o "intestinal infection" x1week. ? ? Pt states that he is having pain when having bowel movements. ? ?Pt states that he has been evaluated for this before a year and a half ago and was told he had an intestinal infection.  ? ?Pt has seen an oncologist and was told he was fine in April. ? ? ?

## 2022-05-03 ENCOUNTER — Encounter: Payer: Self-pay | Admitting: Urology

## 2022-05-08 ENCOUNTER — Ambulatory Visit: Payer: Self-pay | Admitting: Urology

## 2022-05-21 ENCOUNTER — Other Ambulatory Visit: Payer: Self-pay

## 2022-05-21 DIAGNOSIS — Z125 Encounter for screening for malignant neoplasm of prostate: Secondary | ICD-10-CM

## 2022-05-22 ENCOUNTER — Ambulatory Visit: Payer: BC Managed Care – PPO | Admitting: Urology

## 2022-05-22 ENCOUNTER — Encounter: Payer: Self-pay | Admitting: Urology

## 2022-05-22 ENCOUNTER — Other Ambulatory Visit
Admission: RE | Admit: 2022-05-22 | Discharge: 2022-05-22 | Disposition: A | Discharge status: Home / Self Care | Payer: BC Managed Care – PPO | Attending: Urology | Admitting: Urology

## 2022-05-22 VITALS — BP 155/95 | HR 66 | Ht 71.0 in | Wt 222.0 lb

## 2022-05-22 DIAGNOSIS — Z125 Encounter for screening for malignant neoplasm of prostate: Secondary | ICD-10-CM

## 2022-05-22 NOTE — Progress Notes (Signed)
   05/22/2022 8:28 AM   Calvin Carlson 11/01/1965 [id number]  Reason for visit: PSA screening  HPI: 57 year old relatively healthy male with no family history of prostate cancer who was referred in March 2023 for a PSA value of 3.26.  No prior values to review.  He deferred DRE, and opted to repeat PSA in 1 year.  He denies any changes in his health over the last year.  He has not yet had a PSA drawn this year.  We reviewed the risks and benefits of PSA screening, as well as the AUA guidelines today.  He was amenable to a PSA with reflex to free today, and understands possible need for repeat PSA, biopsy, or MRI in the future if PSA continues to rise.  If PSA is normal, likely can resume routine screening every other year with PCP per guideline recommendations.  PSA reflex to free today, call with results   Sondra Come, MD  [facility] [address], [address] [place], [place] [address] [phone]

## 2022-05-22 NOTE — Addendum Note (Signed)
Addended by: Sueanne Margarita on: 05/22/2022 08:30 AM   Modules accepted: Orders

## 2022-05-22 NOTE — Patient Instructions (Signed)

## 2022-05-24 ENCOUNTER — Telehealth: Payer: Self-pay

## 2022-05-24 DIAGNOSIS — Z125 Encounter for screening for malignant neoplasm of prostate: Secondary | ICD-10-CM

## 2022-05-24 LAB — PSA (REFLEX TO FREE) (SERIAL): Prostate Specific Ag, Serum: 2 ng/mL (ref 0.0–4.0)

## 2022-05-24 NOTE — Telephone Encounter (Signed)
Called pt no answer. Left detailed message for pt informing him of the information below.

## 2022-05-24 NOTE — Telephone Encounter (Signed)
-----   Message from Sondra Come, MD sent at 05/24/2022 12:21 PM EDT ----- Good news, PSA normal at 2.0.  Recommend continuing PSA screening every other year with PCP, no urology follow-up needed  Legrand Rams, MD 05/24/2022

## 2023-03-01 ENCOUNTER — Ambulatory Visit: Payer: BC Managed Care – PPO

## 2023-03-01 DIAGNOSIS — K640 First degree hemorrhoids: Secondary | ICD-10-CM | POA: Diagnosis not present

## 2023-03-01 DIAGNOSIS — D125 Benign neoplasm of sigmoid colon: Secondary | ICD-10-CM | POA: Diagnosis not present

## 2023-03-01 DIAGNOSIS — D123 Benign neoplasm of transverse colon: Secondary | ICD-10-CM | POA: Diagnosis not present

## 2023-03-01 DIAGNOSIS — Z1211 Encounter for screening for malignant neoplasm of colon: Secondary | ICD-10-CM | POA: Diagnosis present
